# Patient Record
Sex: Female | Born: 1950 | Race: Black or African American | Hispanic: No | Smoking: Former smoker
Health system: Southern US, Community
[De-identification: ages and names within clinical notes are randomized; demographics above are authoritative.]

## PROBLEM LIST (undated history)

## (undated) DIAGNOSIS — J45909 Unspecified asthma, uncomplicated: Secondary | ICD-10-CM

## (undated) HISTORY — PX: FOOT SURGERY: SHX648

---

## 2012-05-15 ENCOUNTER — Ambulatory Visit: Payer: Self-pay | Admitting: Family Medicine

## 2013-05-19 ENCOUNTER — Ambulatory Visit: Payer: Self-pay | Admitting: Family Medicine

## 2016-03-09 ENCOUNTER — Other Ambulatory Visit: Payer: Self-pay | Admitting: Family Medicine

## 2016-03-09 DIAGNOSIS — Z1231 Encounter for screening mammogram for malignant neoplasm of breast: Secondary | ICD-10-CM

## 2016-03-27 ENCOUNTER — Ambulatory Visit: Payer: Self-pay

## 2016-04-25 ENCOUNTER — Ambulatory Visit
Admission: RE | Admit: 2016-04-25 | Discharge: 2016-04-25 | Disposition: A | Discharge status: Home / Self Care | Payer: Medicare Other | Source: Ambulatory Visit | Attending: Family Medicine | Admitting: Family Medicine

## 2016-04-25 ENCOUNTER — Encounter: Payer: Self-pay | Admitting: Radiology

## 2016-04-25 DIAGNOSIS — Z1231 Encounter for screening mammogram for malignant neoplasm of breast: Secondary | ICD-10-CM

## 2017-03-23 ENCOUNTER — Other Ambulatory Visit: Payer: Self-pay | Admitting: Family Medicine

## 2017-03-23 DIAGNOSIS — Z1231 Encounter for screening mammogram for malignant neoplasm of breast: Secondary | ICD-10-CM

## 2017-04-26 ENCOUNTER — Ambulatory Visit
Admission: RE | Admit: 2017-04-26 | Discharge: 2017-04-26 | Disposition: A | Discharge status: Home / Self Care | Payer: Medicare Other | Source: Ambulatory Visit | Attending: Family Medicine | Admitting: Family Medicine

## 2017-04-26 DIAGNOSIS — Z1231 Encounter for screening mammogram for malignant neoplasm of breast: Secondary | ICD-10-CM | POA: Diagnosis present

## 2017-10-15 ENCOUNTER — Encounter: Payer: Self-pay | Admitting: Emergency Medicine

## 2017-10-15 ENCOUNTER — Emergency Department: Payer: Medicare Other

## 2017-10-15 ENCOUNTER — Other Ambulatory Visit: Payer: Self-pay

## 2017-10-15 ENCOUNTER — Emergency Department
Admission: EM | Admit: 2017-10-15 | Discharge: 2017-10-15 | Disposition: A | Discharge status: Home / Self Care | Payer: Medicare Other | Attending: Emergency Medicine | Admitting: Emergency Medicine

## 2017-10-15 DIAGNOSIS — J40 Bronchitis, not specified as acute or chronic: Secondary | ICD-10-CM | POA: Diagnosis not present

## 2017-10-15 DIAGNOSIS — R0602 Shortness of breath: Secondary | ICD-10-CM

## 2017-10-15 DIAGNOSIS — Z87891 Personal history of nicotine dependence: Secondary | ICD-10-CM | POA: Diagnosis not present

## 2017-10-15 DIAGNOSIS — J45909 Unspecified asthma, uncomplicated: Secondary | ICD-10-CM | POA: Diagnosis not present

## 2017-10-15 DIAGNOSIS — R05 Cough: Secondary | ICD-10-CM | POA: Diagnosis present

## 2017-10-15 HISTORY — DX: Unspecified asthma, uncomplicated: J45.909

## 2017-10-15 MED ORDER — DEXAMETHASONE SODIUM PHOSPHATE 10 MG/ML IJ SOLN
INTRAMUSCULAR | Status: AC
  Administered 2017-10-15: 10 mg via INTRAMUSCULAR
  Filled 2017-10-15: qty 1

## 2017-10-15 MED ORDER — IPRATROPIUM-ALBUTEROL 0.5-2.5 (3) MG/3ML IN SOLN
RESPIRATORY_TRACT | Status: AC
  Administered 2017-10-15: 3 mL via RESPIRATORY_TRACT
  Filled 2017-10-15: qty 6

## 2017-10-15 MED ORDER — IPRATROPIUM-ALBUTEROL 0.5-2.5 (3) MG/3ML IN SOLN
3.0000 mL | Freq: Once | RESPIRATORY_TRACT | Status: AC
  Administered 2017-10-15: 3 mL via RESPIRATORY_TRACT

## 2017-10-15 MED ORDER — ALBUTEROL SULFATE HFA 108 (90 BASE) MCG/ACT IN AERS: 2.0000 | INHALATION_SPRAY | Freq: Four times a day (QID) | RESPIRATORY_TRACT | 2 refills | Status: AC | PRN

## 2017-10-15 MED ORDER — DEXAMETHASONE SODIUM PHOSPHATE 10 MG/ML IJ SOLN
10.0000 mg | Freq: Once | INTRAMUSCULAR | Status: AC
  Administered 2017-10-15: 10 mg via INTRAMUSCULAR

## 2017-10-15 NOTE — ED Provider Notes (Signed)
[facility] Emergency Department Provider Note   ____________________________________________    I have reviewed the triage vital signs and the nursing notes.   HISTORY  Chief Complaint Shortness of Breath     HPI Alexandra Burns is a 67 y.o. female who presents with complaints of cough, mild shortness of breath.  Patient reports this is been going on for many months.  She reports her nighttime coughing is quite severe and keeping her from sleeping.  She does have a history of asthma but her last attack was at age 54.  She smoked but quit 20 years ago.  PCP called her in some albuterol which seemed to help somewhat but she is no longer have any.  No fevers or chills.  No recent travel.  No calf pain or swelling  Past Medical History:  Diagnosis Date  . Asthma     There are no active problems to display for this patient.   Past Surgical History:  Procedure Laterality Date  . FOOT SURGERY      Prior to Admission medications   Medication Sig Start Date End Date Taking? Authorizing Provider  albuterol (PROVENTIL HFA;VENTOLIN HFA) 108 (90 Base) MCG/ACT inhaler Inhale 2 puffs into the lungs every 6 (six) hours as needed for wheezing or shortness of breath. 10/15/17   Jene Every, MD     Allergies Patient has no known allergies.  Family History  Problem Relation Age of Onset  . Breast cancer Maternal Aunt     Social History Social History   Tobacco Use  . Smoking status: Former Games developer  . Smokeless tobacco: Never Used  Substance Use Topics  . Alcohol use: Never    Frequency: Never  . Drug use: Never    Review of Systems  Constitutional: No fever/chills Eyes: No visual changes.  ENT: No throat swelling Cardiovascular: Mild chest tightness Respiratory: As above Gastrointestinal: No abdominal pain.  Genitourinary: Negative for dysuria. Musculoskeletal: Negative for leg swelling Skin: Negative for rash. Neurological: Negative  for headaches   ____________________________________________   PHYSICAL EXAM:  VITAL SIGNS: ED Triage Vitals  Enc Vitals Group     BP 10/15/17 0751 134/67     Pulse Rate 10/15/17 0751 85     Resp 10/15/17 0751 20     Temp 10/15/17 0751 98.3 F (36.8 C)     Temp Source 10/15/17 0751 Oral     SpO2 10/15/17 0751 94 %     Weight 10/15/17 0747 104.3 kg (230 lb)     Height 10/15/17 0747 1.651 m ( )     Head Circumference --      Peak Flow --      Pain Score 10/15/17 0747 10     Pain Loc --      Pain Edu? --      Excl. in GC? --     Constitutional: Alert and oriented. No acute distress. Pleasant and interactive Eyes: Conjunctivae are normal.   Nose: No congestion/rhinnorhea. Mouth/Throat: Mucous membranes are moist.  Next is normal  Cardiovascular: Normal rate, regular rhythm. Grossly normal heart sounds.  Good peripheral circulation. Respiratory: Normal respiratory effort.  No retractions.  Mild wheezes, primarily lower lung fields Gastrointestinal: Soft and nontender. No distention.   Musculoskeletal: No lower extremity tenderness nor edema.  Warm and well perfused Neurologic:  Normal speech and language. No gross focal neurologic deficits are appreciated.  Skin:  Skin is warm, dry and intact. No rash noted. Psychiatric: Mood and affect  are normal. Speech and behavior are normal.  ____________________________________________   LABS (all labs ordered are listed, but only abnormal results are displayed)  Labs Reviewed - No data to display ____________________________________________  EKG  ED ECG REPORT I, Jene Every, the attending physician, personally viewed and interpreted this ECG.  Date: 10/15/2017  Rhythm: normal sinus rhythm QRS Axis: normal Intervals: normal ST/T Wave abnormalities: normal Narrative Interpretation: PVC, occasional  ____________________________________________  RADIOLOGY  Chest x-ray no acute distress, suspicious for  COPD ____________________________________________   PROCEDURES  Procedure(s) performed: No  Procedures   Critical Care performed: No ____________________________________________   INITIAL IMPRESSION / ASSESSMENT AND PLAN / ED COURSE  Pertinent labs & imaging results that were available during my care of the patient were reviewed by me and considered in my medical decision making (see chart for details).  Patient presents with many months of cough, mild shortness of breath with wheezing.  Most consistent with chronic bronchitis, does not smoke.  Will treat with DuoNeb's, IM Decadron and reevaluate  After treatment patient felt significant better and anxious to go home.  I have asked her to follow-up closely with her PCP for further management of likely chronic bronchitis/bronchospasm symptoms    ____________________________________________   FINAL CLINICAL IMPRESSION(S) / ED DIAGNOSES  Final diagnoses:  Shortness of breath  Bronchitis        Note:  This document was prepared using Dragon voice recognition software and may include unintentional dictation errors.    Jene Every, MD 10/15/17 1435

## 2017-10-15 NOTE — ED Triage Notes (Signed)
Pt here for shortness of breath.  Has had cough since November per pt but starting yesterday feels like can't catch breath. Only hx asthma and has tried inhaler without relief.  C/o chest feeling tight.  Unlabored at this time.

## 2018-03-18 ENCOUNTER — Other Ambulatory Visit: Payer: Self-pay | Admitting: Family Medicine

## 2018-03-18 DIAGNOSIS — Z1231 Encounter for screening mammogram for malignant neoplasm of breast: Secondary | ICD-10-CM

## 2018-04-29 ENCOUNTER — Ambulatory Visit
Admission: RE | Admit: 2018-04-29 | Discharge: 2018-04-29 | Disposition: A | Discharge status: Home / Self Care | Payer: Medicare Other | Source: Ambulatory Visit | Attending: Family Medicine | Admitting: Family Medicine

## 2018-04-29 DIAGNOSIS — Z1231 Encounter for screening mammogram for malignant neoplasm of breast: Secondary | ICD-10-CM | POA: Insufficient documentation

## 2019-04-04 ENCOUNTER — Other Ambulatory Visit: Payer: Self-pay | Admitting: Family Medicine

## 2019-04-04 DIAGNOSIS — Z1231 Encounter for screening mammogram for malignant neoplasm of breast: Secondary | ICD-10-CM

## 2019-05-02 ENCOUNTER — Ambulatory Visit
Admission: RE | Admit: 2019-05-02 | Discharge: 2019-05-02 | Disposition: A | Discharge status: Home / Self Care | Payer: Medicare Other | Source: Ambulatory Visit | Attending: Family Medicine | Admitting: Family Medicine

## 2019-05-02 DIAGNOSIS — Z1231 Encounter for screening mammogram for malignant neoplasm of breast: Secondary | ICD-10-CM | POA: Diagnosis present

## 2020-03-25 ENCOUNTER — Other Ambulatory Visit: Payer: Self-pay | Admitting: Family Medicine

## 2020-03-25 DIAGNOSIS — Z1231 Encounter for screening mammogram for malignant neoplasm of breast: Secondary | ICD-10-CM

## 2020-05-04 ENCOUNTER — Other Ambulatory Visit: Payer: Self-pay

## 2020-05-04 ENCOUNTER — Ambulatory Visit
Admission: RE | Admit: 2020-05-04 | Discharge: 2020-05-04 | Disposition: A | Discharge status: Home / Self Care | Payer: Medicare PPO | Source: Ambulatory Visit | Attending: Family Medicine | Admitting: Family Medicine

## 2020-05-04 DIAGNOSIS — Z1231 Encounter for screening mammogram for malignant neoplasm of breast: Secondary | ICD-10-CM | POA: Diagnosis present

## 2020-10-06 DIAGNOSIS — E782 Mixed hyperlipidemia: Secondary | ICD-10-CM | POA: Diagnosis not present

## 2020-10-13 DIAGNOSIS — I10 Essential (primary) hypertension: Secondary | ICD-10-CM | POA: Diagnosis not present

## 2020-10-13 DIAGNOSIS — J449 Chronic obstructive pulmonary disease, unspecified: Secondary | ICD-10-CM | POA: Diagnosis not present

## 2020-10-13 DIAGNOSIS — Z6841 Body Mass Index (BMI) 40.0 and over, adult: Secondary | ICD-10-CM | POA: Diagnosis not present

## 2020-10-13 DIAGNOSIS — E782 Mixed hyperlipidemia: Secondary | ICD-10-CM | POA: Diagnosis not present

## 2021-03-29 ENCOUNTER — Other Ambulatory Visit: Payer: Self-pay | Admitting: Family Medicine

## 2021-03-29 DIAGNOSIS — Z1231 Encounter for screening mammogram for malignant neoplasm of breast: Secondary | ICD-10-CM

## 2021-04-07 DIAGNOSIS — E782 Mixed hyperlipidemia: Secondary | ICD-10-CM | POA: Diagnosis not present

## 2021-04-14 ENCOUNTER — Other Ambulatory Visit: Payer: Self-pay | Admitting: Family Medicine

## 2021-04-14 DIAGNOSIS — E782 Mixed hyperlipidemia: Secondary | ICD-10-CM | POA: Diagnosis not present

## 2021-04-14 DIAGNOSIS — N289 Disorder of kidney and ureter, unspecified: Secondary | ICD-10-CM | POA: Diagnosis not present

## 2021-04-14 DIAGNOSIS — Z0000 Encounter for general adult medical examination without abnormal findings: Secondary | ICD-10-CM | POA: Diagnosis not present

## 2021-04-14 DIAGNOSIS — Z1211 Encounter for screening for malignant neoplasm of colon: Secondary | ICD-10-CM | POA: Diagnosis not present

## 2021-04-14 DIAGNOSIS — Z1389 Encounter for screening for other disorder: Secondary | ICD-10-CM | POA: Diagnosis not present

## 2021-04-14 DIAGNOSIS — Z780 Asymptomatic menopausal state: Secondary | ICD-10-CM | POA: Diagnosis not present

## 2021-04-14 DIAGNOSIS — I10 Essential (primary) hypertension: Secondary | ICD-10-CM | POA: Diagnosis not present

## 2021-04-14 DIAGNOSIS — Z1231 Encounter for screening mammogram for malignant neoplasm of breast: Secondary | ICD-10-CM

## 2021-04-14 DIAGNOSIS — Z1159 Encounter for screening for other viral diseases: Secondary | ICD-10-CM | POA: Diagnosis not present

## 2021-05-06 ENCOUNTER — Ambulatory Visit
Admission: RE | Admit: 2021-05-06 | Discharge: 2021-05-06 | Disposition: A | Discharge status: Home / Self Care | Payer: Medicare HMO | Source: Ambulatory Visit | Attending: Family Medicine | Admitting: Family Medicine

## 2021-05-06 ENCOUNTER — Other Ambulatory Visit: Payer: Self-pay

## 2021-05-06 DIAGNOSIS — Z1231 Encounter for screening mammogram for malignant neoplasm of breast: Secondary | ICD-10-CM | POA: Insufficient documentation

## 2022-03-31 ENCOUNTER — Other Ambulatory Visit: Payer: Self-pay | Admitting: Family Medicine

## 2022-03-31 DIAGNOSIS — Z1231 Encounter for screening mammogram for malignant neoplasm of breast: Secondary | ICD-10-CM

## 2022-05-08 ENCOUNTER — Ambulatory Visit
Admission: RE | Admit: 2022-05-08 | Discharge: 2022-05-08 | Disposition: A | Discharge status: Home / Self Care | Payer: Medicare PPO | Source: Ambulatory Visit | Attending: Family Medicine | Admitting: Family Medicine

## 2022-05-08 DIAGNOSIS — Z1231 Encounter for screening mammogram for malignant neoplasm of breast: Secondary | ICD-10-CM | POA: Insufficient documentation

## 2022-08-09 IMAGING — MG MM DIGITAL SCREENING BILAT W/ TOMO AND CAD
8 series · 8 of 24 positions shown · non-contrast
Comparison: Previous exam(s).

CLINICAL DATA: Screening.

EXAM:
DIGITAL SCREENING BILATERAL MAMMOGRAM WITH TOMOSYNTHESIS AND CAD
TECHNIQUE: Bilateral screening digital craniocaudal and mediolateral oblique
mammograms were obtained. Bilateral screening digital breast
tomosynthesis was performed. The images were evaluated with
computer-aided detection.

[L MLO synth-2D]
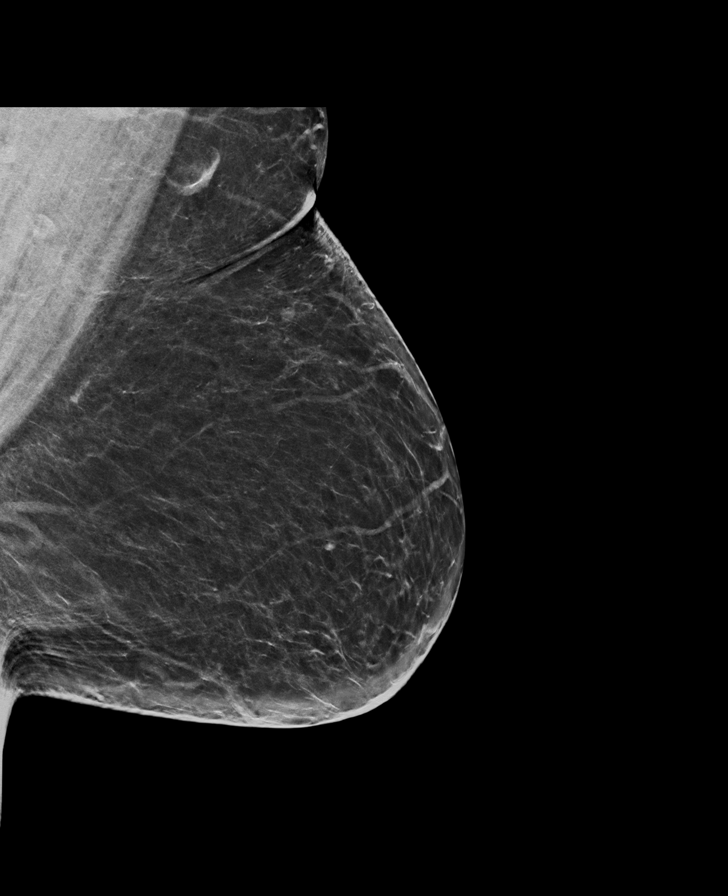

[R MLO synth-2D]
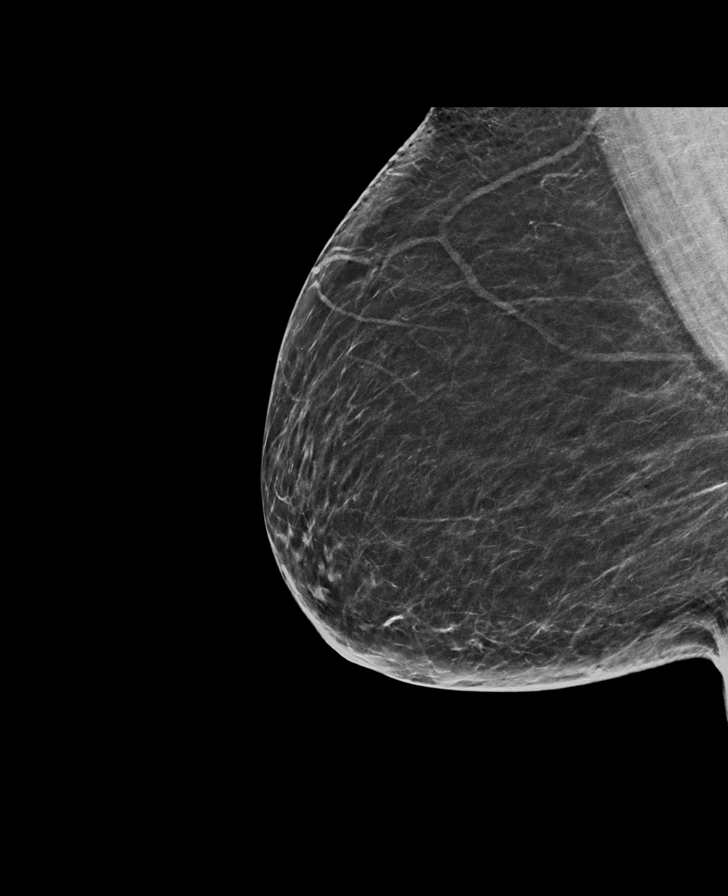

[R CC synth-2D]
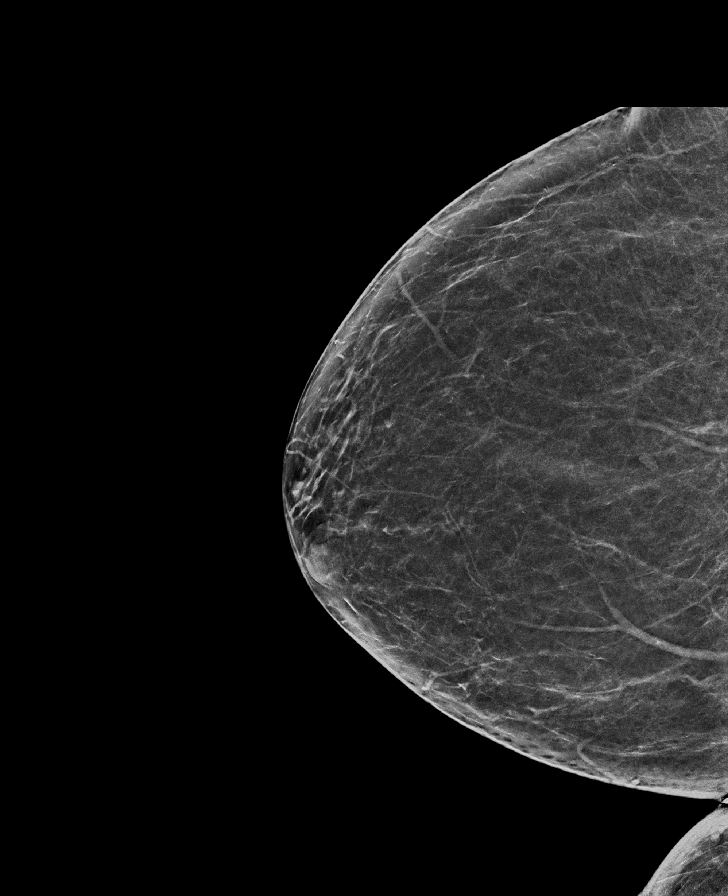

[L CC synth-2D]
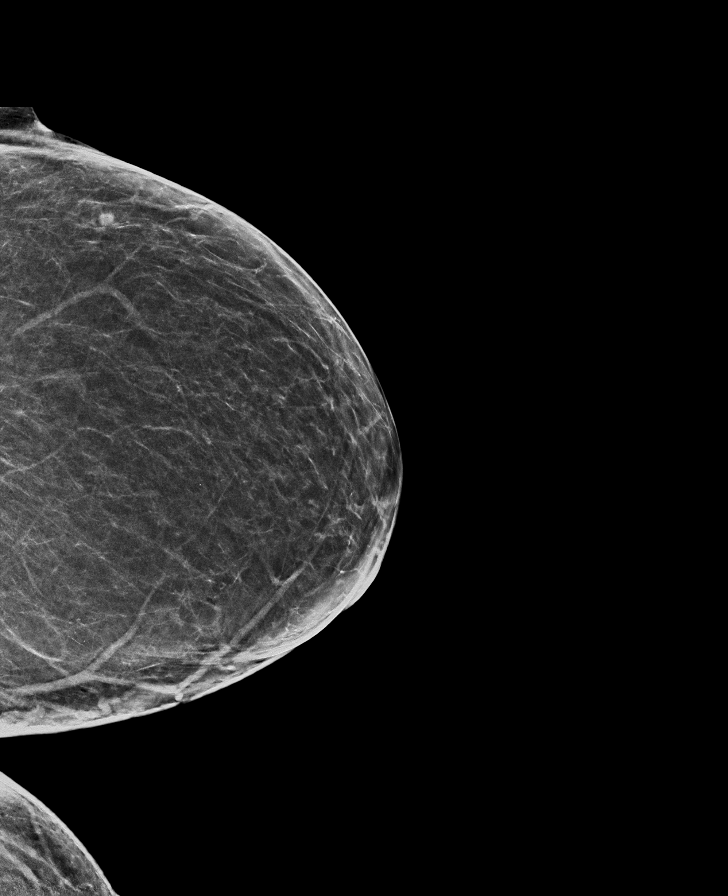

[L MLO tomo · tomo slice 39/76.0]
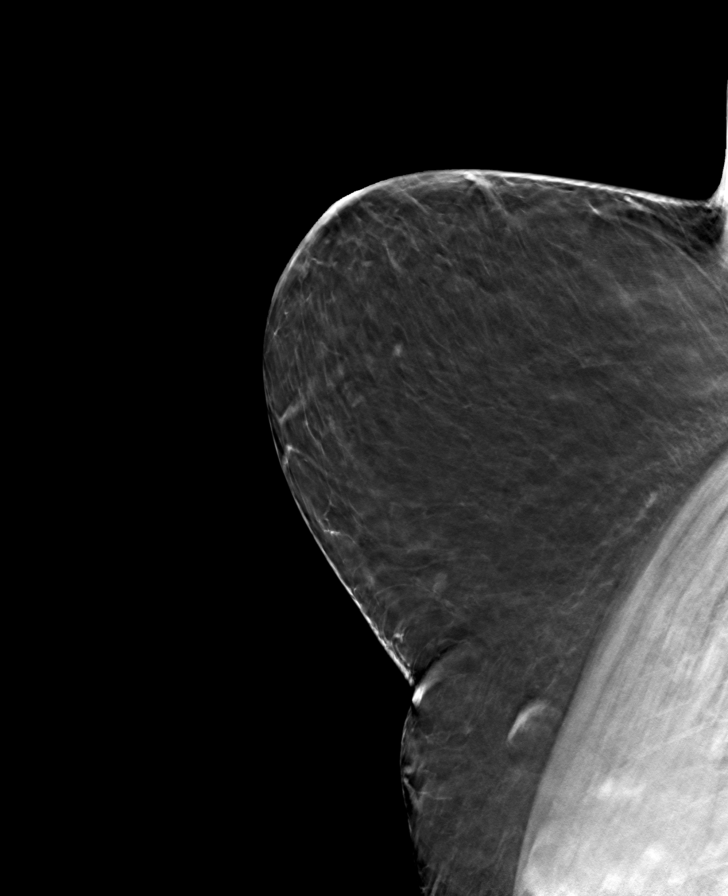

[R CC tomo · tomo slice 33/66.0]
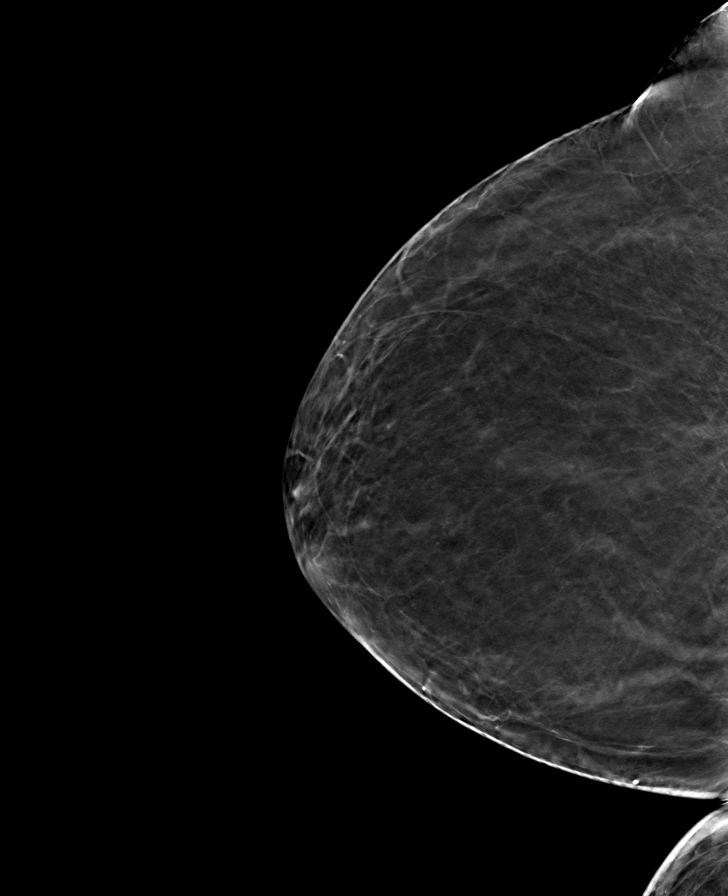

[R MLO tomo · tomo slice 36/71.0]
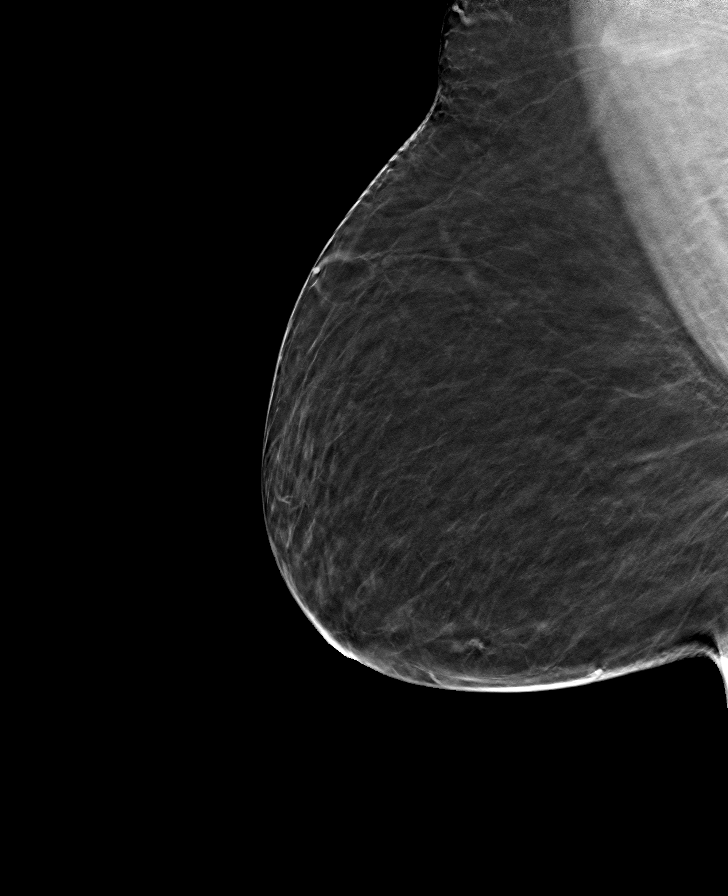

[L CC tomo · tomo slice 33/64.0]
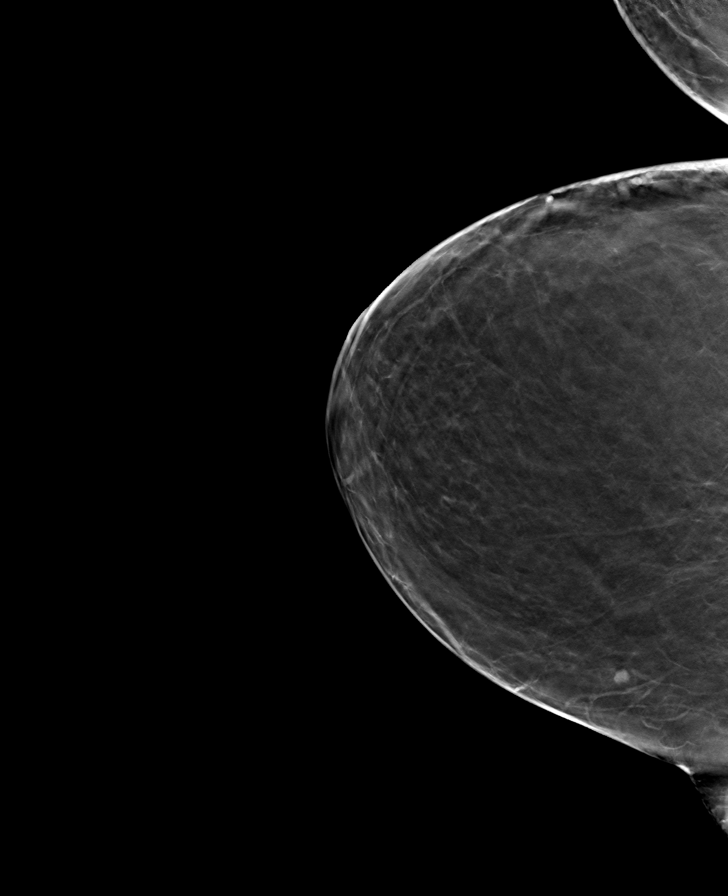

[8 of 24 positions shown; findings below may reference images not displayed]

ACR Breast Density Category b: There are scattered areas of
fibroglandular density.
FINDINGS: There are no findings suspicious for malignancy.
IMPRESSION: No mammographic evidence of malignancy. A result letter of this
screening mammogram will be mailed directly to the patient.

RECOMMENDATION:
Screening mammogram in one year. (Code:51-O-LD2)

BI-RADS CATEGORY  1: Negative.

## 2023-04-17 ENCOUNTER — Other Ambulatory Visit: Payer: Self-pay | Admitting: Family Medicine

## 2023-04-17 DIAGNOSIS — Z1231 Encounter for screening mammogram for malignant neoplasm of breast: Secondary | ICD-10-CM

## 2023-05-10 ENCOUNTER — Ambulatory Visit
Admission: RE | Admit: 2023-05-10 | Discharge: 2023-05-10 | Disposition: A | Discharge status: Home / Self Care | Payer: Medicare PPO | Source: Ambulatory Visit | Attending: Family Medicine | Admitting: Family Medicine

## 2023-05-10 DIAGNOSIS — Z1231 Encounter for screening mammogram for malignant neoplasm of breast: Secondary | ICD-10-CM | POA: Diagnosis present
